# Patient Record
Sex: Male | Born: 1995 | Race: Black or African American | Hispanic: No | Marital: Single | Smoking: Never smoker
Health system: Southern US, Community
[De-identification: ages and names within clinical notes are randomized; demographics above are authoritative.]

## PROBLEM LIST (undated history)

## (undated) DIAGNOSIS — K219 Gastro-esophageal reflux disease without esophagitis: Secondary | ICD-10-CM

## (undated) DIAGNOSIS — J302 Other seasonal allergic rhinitis: Secondary | ICD-10-CM

## (undated) HISTORY — DX: Other seasonal allergic rhinitis: J30.2

## (undated) HISTORY — PX: NO PAST SURGERIES: SHX2092

## (undated) HISTORY — DX: Gastro-esophageal reflux disease without esophagitis: K21.9

---

## 2013-07-31 ENCOUNTER — Other Ambulatory Visit: Payer: Self-pay | Admitting: Family Medicine

## 2013-07-31 DIAGNOSIS — E010 Iodine-deficiency related diffuse (endemic) goiter: Secondary | ICD-10-CM

## 2013-08-07 ENCOUNTER — Ambulatory Visit
Admission: RE | Admit: 2013-08-07 | Discharge: 2013-08-07 | Disposition: A | Discharge status: Home / Self Care | Payer: BC Managed Care – PPO | Source: Ambulatory Visit | Attending: Family Medicine | Admitting: Family Medicine

## 2013-08-07 DIAGNOSIS — E010 Iodine-deficiency related diffuse (endemic) goiter: Secondary | ICD-10-CM

## 2013-08-22 ENCOUNTER — Other Ambulatory Visit: Payer: Self-pay

## 2014-10-23 IMAGING — US US SOFT TISSUE HEAD/NECK
1 series · 14 of 25 positions shown · non-contrast
Comparison: None.

CLINICAL DATA: Thyromegaly on physical exam

EXAM:
THYROID ULTRASOUND
TECHNIQUE: Ultrasound examination of the thyroid gland and adjacent soft
tissues was performed.

[Series 1: us soft tissue head/neck · 0.04mm/px · 14 of 41 slices shown]
[im 1/41]
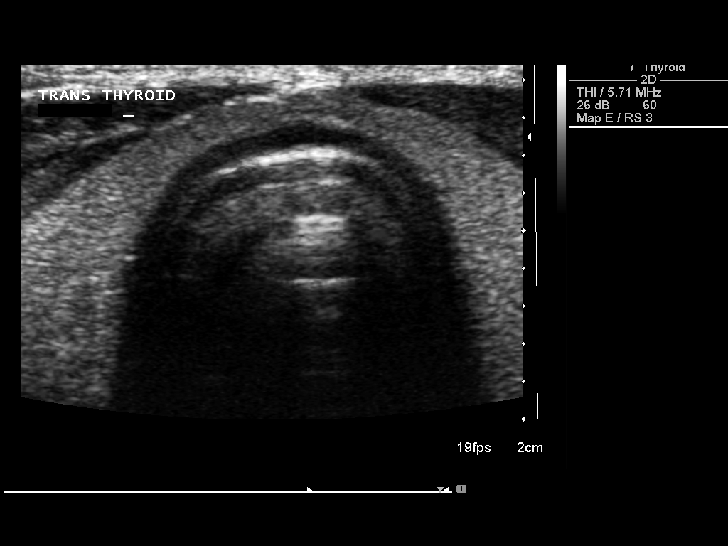
[im 4/41]
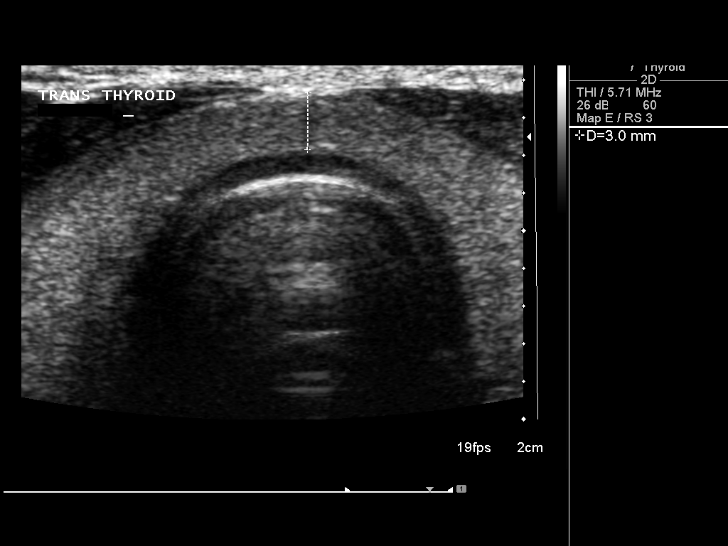
[im 7/41]
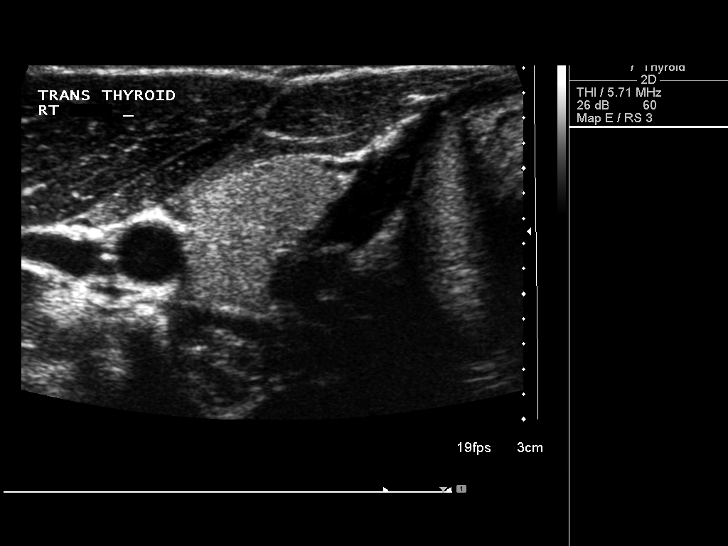
[im 11/41]
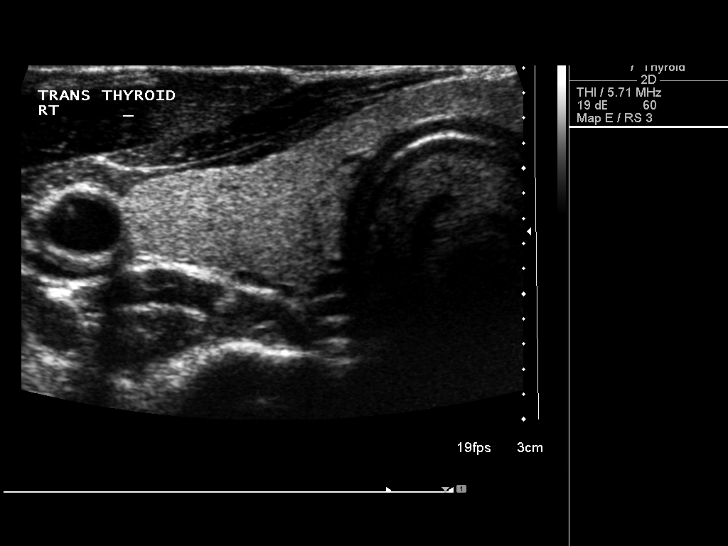
[im 14/41]
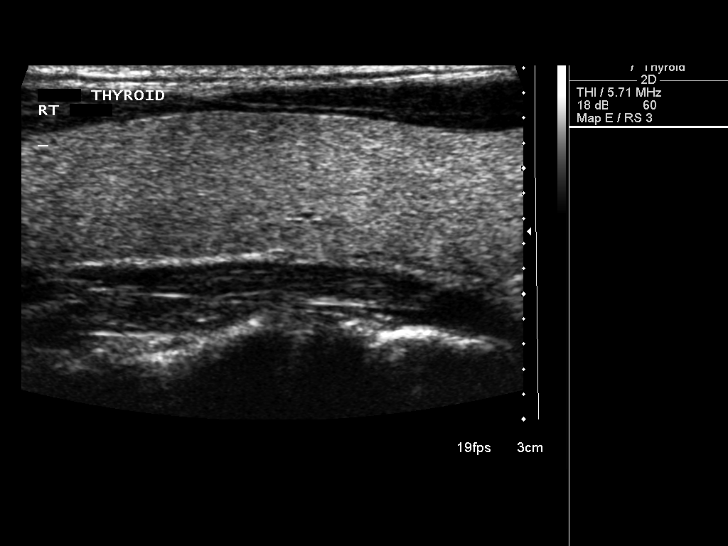
[im 16/41]
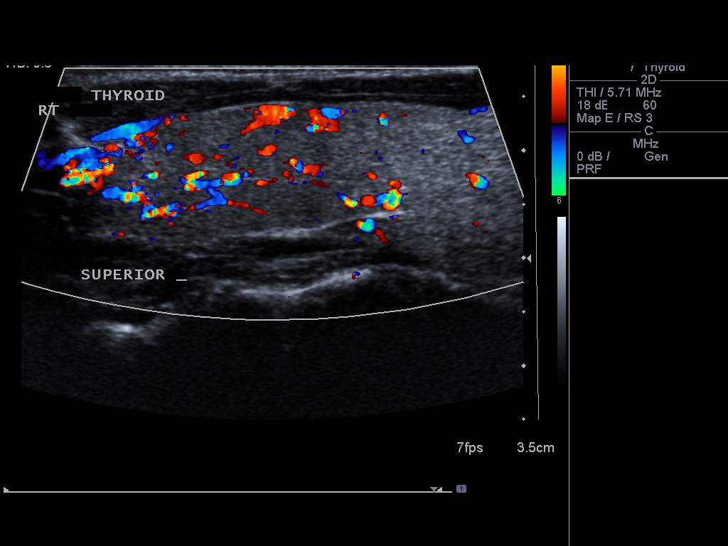
[im 19/41]
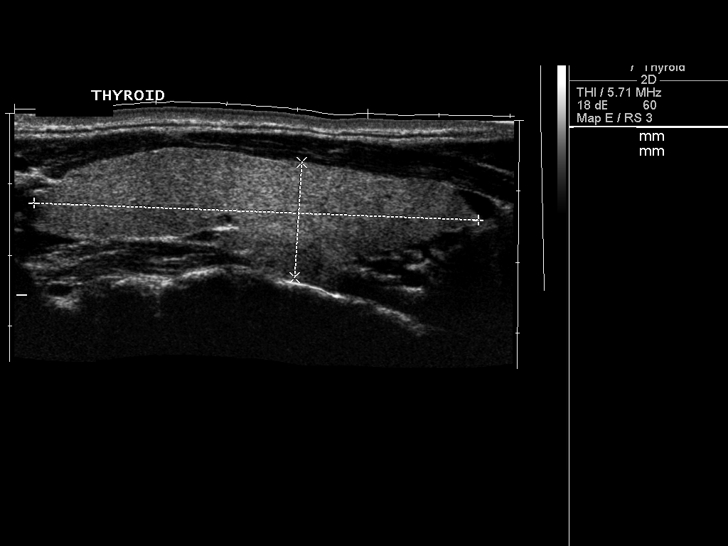
[im 22/41]
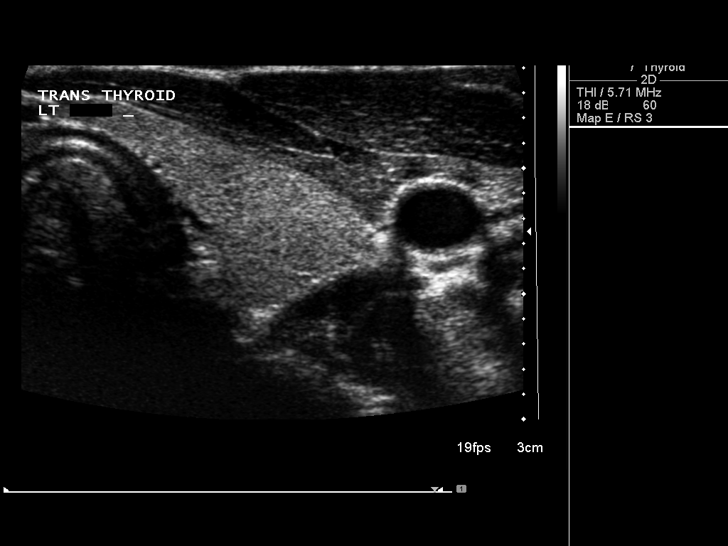
[im 26/41]
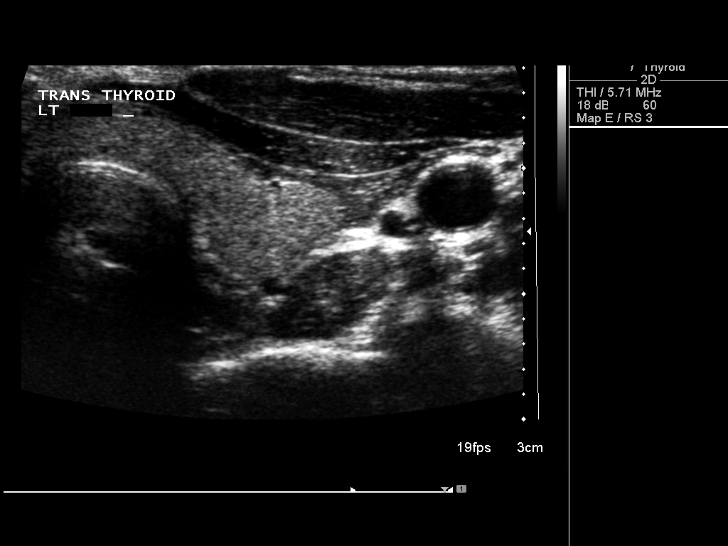
[im 27/41]
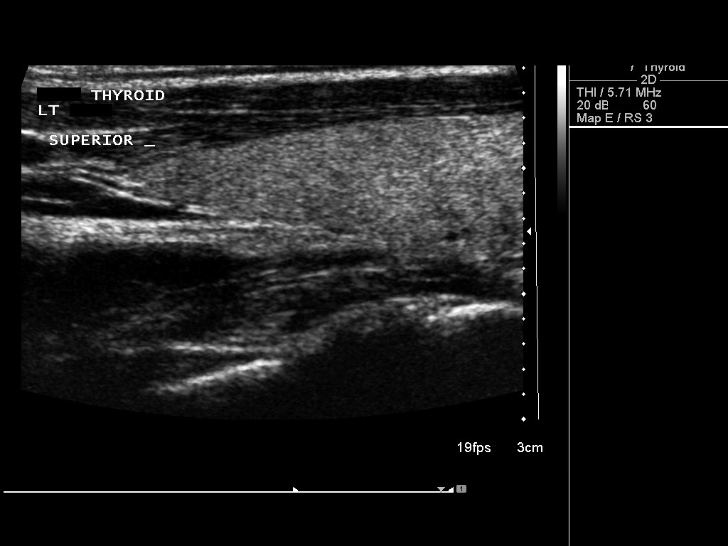
[im 31/41]
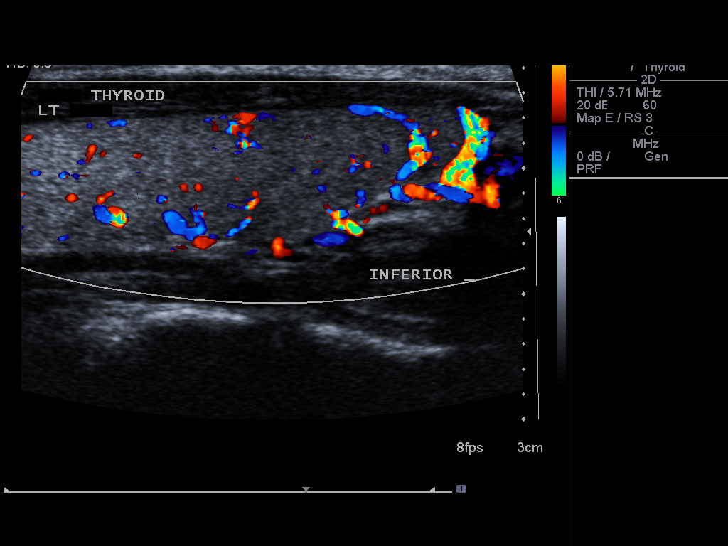
[im 34/41]
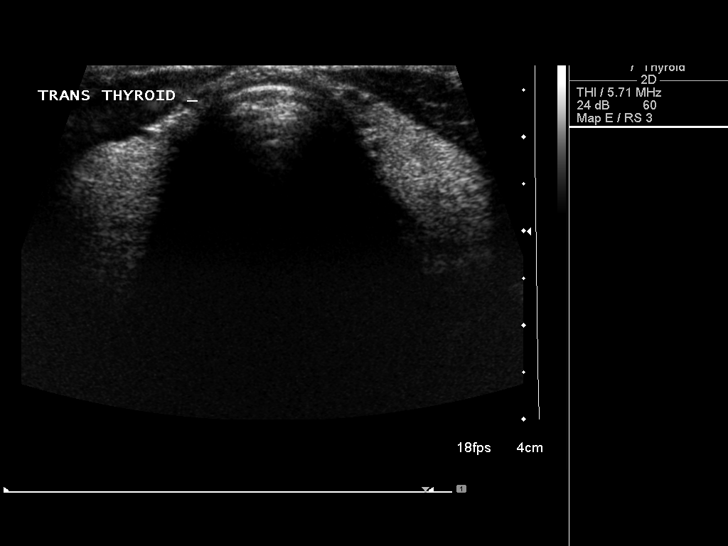
[im 37/41]
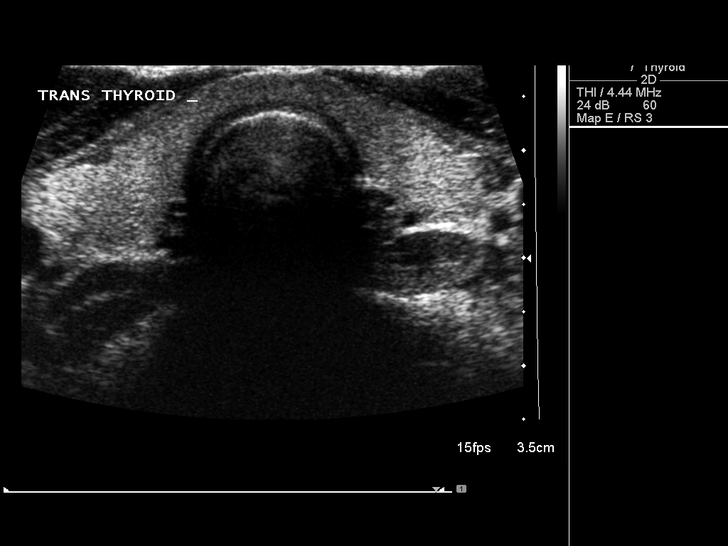
[im 41/41]
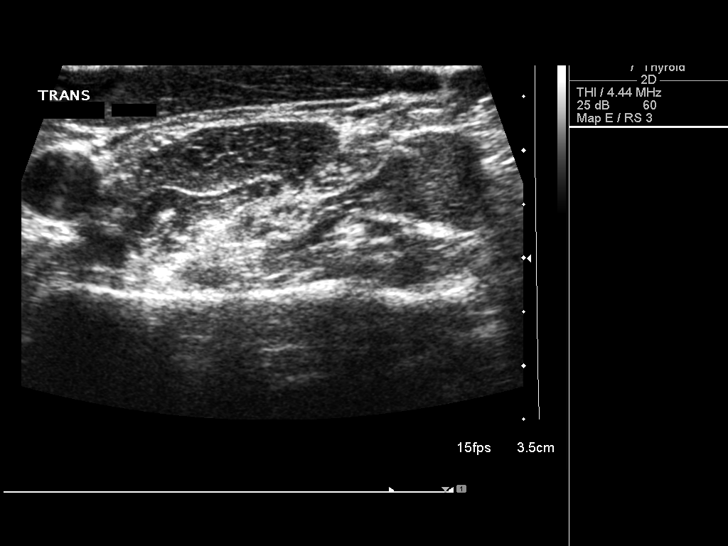

[14 of 25 positions shown; findings below may reference images not displayed]

FINDINGS: Right thyroid lobe

Measurements: 63 x 16 x 20 mm.  No nodules visualized.

Left thyroid lobe

Measurements: 55 x 12 x 16 mm.  Normal thyroid echotexture.

Isthmus

Thickness: 3.8 mm.  No nodules visualized.

Lymphadenopathy

None visualized.
IMPRESSION: Mild thyromegaly without focal lesion.

## 2015-03-27 ENCOUNTER — Ambulatory Visit (INDEPENDENT_AMBULATORY_CARE_PROVIDER_SITE_OTHER): Payer: BC Managed Care – PPO | Admitting: Neurology

## 2015-03-27 ENCOUNTER — Encounter: Payer: Self-pay | Admitting: Neurology

## 2015-03-27 VITALS — BP 110/72 | HR 63 | Ht 68.0 in | Wt 119.0 lb

## 2015-03-27 DIAGNOSIS — G250 Essential tremor: Secondary | ICD-10-CM | POA: Diagnosis not present

## 2015-03-27 NOTE — Progress Notes (Signed)
Note routed to Dr Manson Passey.

## 2015-03-27 NOTE — Progress Notes (Signed)
Subjective:   Aaron Patterson was seen in consultation in the movement disorder clinic at the request of Boneta Lucks, NP.  The evaluation is for tremor.  He is accompanied by his mother who supplements the history.  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  The right hand shakes more than the left but both are involved.  He is right hand dominant.  It is most with use and never with rest.  He notices it most when writing.  His dad may have a little tremor but he is unsure.  No supplements except whey protein.  No creatine.  Doesn't really bother him enough to want to take medication.   Affected by caffeine: doesn't know (rarely drinks it) Affected by alcohol:  Does drink some but doesn't know if it affects it Affected by stress:  Yes.   Affected by fatigue:  No. Spills soup if on spoon:  No. Spills glass of liquid if full:  No. Affects ADL's (tying shoes, brushing teeth, etc):  No.  Current/Previously tried tremor medications: None  Current medications that may exacerbate tremor:  None  Outside reports reviewed: historical medical records, lab reports and referral letter/letters.  No Known Allergies  Outpatient Encounter Prescriptions as of 03/27/2015  Medication Sig  . cetirizine (ZYRTEC) 10 MG tablet Take 10 mg by mouth daily.  Marland Kitchen doxycycline (ADOXA) 100 MG tablet Take 100 mg by mouth 2 (two) times daily.  . pantoprazole (PROTONIX) 40 MG tablet Take 40 mg by mouth daily.  Marland Kitchen tretinoin (RETIN-A) 0.05 % cream Apply topically at bedtime.   No facility-administered encounter medications on file as of 03/27/2015.    Past Medical History  Diagnosis Date  . GERD (gastroesophageal reflux disease)   . Seasonal allergies     Past Surgical History  Procedure Laterality Date  . No past surgeries      Social History   Social History  . Marital Status: Single    Spouse Name: N/A  . Number of Children: N/A  . Years of Education: N/A    Occupational History  . Not on file.   Social History Main Topics  . Smoking status: Never Smoker   . Smokeless tobacco: Not on file  . Alcohol Use: 0.0 oz/week    0 Standard drinks or equivalent per week     Comment: once a month  . Drug Use: No  . Sexual Activity: Not on file   Other Topics Concern  . Not on file   Social History Narrative  . No narrative on file    Family Status  Relation Status Death Age  . Father Alive     HTN  . Mother Alive     healthy  . Sister Alive     healthy    Review of Systems A complete 10 system ROS was obtained and was negative apart from what is mentioned.   Objective:   VITALS:   Filed Vitals:   03/27/15 0843  BP: 110/72  Pulse: 63  Height:  (1.727 m)  Weight: 119 lb (53.978 kg)   Gen:  Appears stated age and in NAD. HEENT:  Normocephalic, atraumatic. The mucous membranes are moist. The superficial temporal arteries are without ropiness or tenderness. Cardiovascular: Regular rate and rhythm. Lungs: Clear to auscultation bilaterally. Neck: There are no carotid bruits noted bilaterally.  NEUROLOGICAL:  Orientation:  The patient is alert and oriented x 3.  Recent and remote memory  are intact.  Attention span and concentration are normal.  Able to name objects and repeat without trouble.  Fund of knowledge is appropriate Cranial nerves: There is good facial symmetry. The pupils are equal round and reactive to light bilaterally. Fundoscopic exam reveals clear disc margins bilaterally. Extraocular muscles are intact and visual fields are full to confrontational testing. Speech is fluent and clear. Soft palate rises symmetrically and there is no tongue deviation. Hearing is intact to conversational tone. Tone: Tone is good throughout. Sensation: Sensation is intact to light touch and pinprick throughout (facial, trunk, extremities). Vibration is intact at the bilateral big toe. There is no extinction with double simultaneous  stimulation. There is no sensory dermatomal level identified. Coordination:  The patient has no dysdiadichokinesia or dysmetria. Motor: Strength is 5/5 in the bilateral upper and lower extremities.  Shoulder shrug is equal bilaterally.  There is no pronator drift.  There are no fasciculations noted. DTR's: Deep tendon reflexes are 2/4 at the bilateral biceps, triceps, brachioradialis, patella and achilles.  Plantar responses are downgoing bilaterally. Gait and Station: The patient is able to ambulate without difficulty. The patient is able to heel toe walk without any difficulty. The patient is able to ambulate in a tandem fashion. The patient is able to stand in the Romberg position.   MOVEMENT EXAM: Tremor:  There is mild tremor in the UE, noted most significantly with action.  The patient is able to draw Archimedes spirals without significant difficulty.  There is no tremor at rest.  The patient is able to pour water from one glass to another without spilling it.  Labs  I got lab work from his primary care physician dated 10/14/2014.  His white blood cells were 6.3, hemoglobin 15.7, hematocrit 48.9 and platelets 340.  His sodium was 140, potassium 4.2, chloride 104, CO2 31, glucose 82, AST, 15, ALT 11 and alkaline phosphatase 53.  His TSH was normal at 2.42.     Assessment/Plan:   1.  Essential Tremor.  -This is evidenced by the symmetrical nature and history of getting worse, although very slowly so.  He really wants no treatment for this.  There was nothing focal or lateralizing on his examination today and I saw no reason to scan his brain.  I reviewed lab work from his primary care physician, as above.  I talked to him and his mother about nature and pathophysiology of this.  I did tell them that this could slowly get worse with time and he may want treatment in the future.  If so, they can certainly let me know.  They were given patient handout/literature on essential tremor.  They will  follow up with me on an as-needed basis.  Greater than 50% of the 45 minute visit was spent in counseling.

## 2017-06-21 NOTE — Progress Notes (Addendum)
Subjective:   Aaron Patterson was seen in consultation in the movement disorder clinic.   The evaluation is for tremor.  He is accompanied by his mother who supplements the history.  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  The right hand shakes more than the left but both are involved.  He is right hand dominant.  It is most with use and never with rest.  He notices it most when writing.  His dad may have a little tremor but he is unsure.  No supplements except whey protein.  No creatine.  Doesn't really bother him enough to want to take medication.   Affected by caffeine: doesn't know (rarely drinks it) Affected by alcohol:  Does drink some but doesn't know if it affects it Affected by stress:  Yes.   Affected by fatigue:  No. Spills soup if on spoon:  No. Spills glass of liquid if full:  No. Affects ADL's (tying shoes, brushing teeth, etc):  No.  Current/Previously tried tremor medications: None  Current medications that may exacerbate tremor:  None  Outside reports reviewed: historical medical records, lab reports and referral letter/letters.  06/23/17 update:  Pt seen in f/u for tremor.  I have not seen him in over 2 years.  Tremor has not changed significantly in that time.  He is trying to get in [employer] now.  They are doing PE for patient to be able to enter.  Tremor not interfering with ADL's.  Not bothersome with anything he needs to do.  No increase with stress/nervousness.  States that had bloodwork last month.  No falls.  No lateralizing weakness or paresthesias.    No Known Allergies  Outpatient Encounter Medications as of 06/23/2017  Medication Sig  . cetirizine (ZYRTEC) 10 MG tablet Take 10 mg as needed by mouth.   . pantoprazole (PROTONIX) 40 MG tablet Take 40 mg as needed by mouth.   . [DISCONTINUED] doxycycline (ADOXA) 100 MG tablet Take 100 mg by mouth 2 (two) times daily.  . [DISCONTINUED] tretinoin (RETIN-A) 0.05 % cream  Apply topically at bedtime.   No facility-administered encounter medications on file as of 06/23/2017.     Past Medical History:  Diagnosis Date  . GERD (gastroesophageal reflux disease)   . Seasonal allergies     Past Surgical History:  Procedure Laterality Date  . NO PAST SURGERIES      Social History   Socioeconomic History  . Marital status: Single    Spouse name: Not on file  . Number of children: Not on file  . Years of education: Not on file  . Highest education level: Not on file  Social Needs  . Financial resource strain: Not on file  . Food insecurity - worry: Not on file  . Food insecurity - inability: Not on file  . Transportation needs - medical: Not on file  . Transportation needs - non-medical: Not on file  Occupational History  . Occupation: [employer]  Tobacco Use  . Smoking status: Never Smoker  . Smokeless tobacco: Never Used  Substance and Sexual Activity  . Alcohol use: Yes    Alcohol/week: 0.0 oz    Comment: once a month  . Drug use: No  . Sexual activity: Not on file  Other Topics Concern  . Not on file  Social History Narrative  . Not on file    Family Status  Relation Name Status  . Father  Alive  HTN  . Mother  Alive       healthy  . Sister  Alive       healthy    Review of Systems A complete 10 system ROS was obtained and was negative apart from what is mentioned.   Objective:    VITALS:   Vitals:   06/23/17 1257  BP: 128/78  Pulse: 70  SpO2: 98%  Weight: 126 lb (57.2 kg)  Height: 5\' 8"  (1.727 m)   Gen:  Appears stated age and in NAD. HEENT:  Normocephalic, atraumatic. The mucous membranes are moist. The superficial temporal arteries are without ropiness or tenderness. Cardiovascular: Regular rate and rhythm. Lungs: Clear to auscultation bilaterally. Neck: There are no carotid bruits noted bilaterally.  NEUROLOGICAL:  Orientation:  The patient is alert and oriented x 3.  Cranial nerves: There is good facial  symmetry. The pupils are equal round and reactive to light bilaterally. Fundoscopic exam reveals clear disc margins bilaterally. Extraocular muscles are intact and visual fields are full to confrontational testing. Speech is fluent and clear. Soft palate rises symmetrically and there is no tongue deviation. Hearing is intact to conversational tone. Tone: Tone is good throughout. Sensation: Sensation is intact to light touch.   Vibration is intact at the bilateral big toe. There is no extinction with double simultaneous stimulation. There is no sensory dermatomal level identified. Coordination:  The patient has no difficulty with RAM's or FNF bilaterally. Motor: Strength is 5/5 in the bilateral upper and lower extremities.  Shoulder shrug is equal bilaterally.  There is no pronator drift.  There are no fasciculations noted. DTR's: Deep tendon reflexes are 2+/4 at the bilateral biceps, triceps, brachioradialis, patella and achilles.  Plantar responses are downgoing bilaterally. Gait and Station: The patient is able to ambulate without difficulty. The patient is able to heel toe walk without any difficulty. The patient is able to ambulate in a tandem fashion.   MOVEMENT EXAM: Tremor:  There is no rest tremor.  There is mild postural tremor on the right.  It doesn't increase with intention.  It is better with a weight in the hand.  he has no difficulty with archimedes spirals (compared drawing to 2 years ago and no significant changeI.  he has no difficulty when asked to pour a full glass of water from one glass to another.     Assessment/Plan:   1.  Essential tremor  -no significant change in examination from 2 years ago  - We discussed nature and pathophysiology.  We discussed that this can continue to gradually get worse with time.  His has not gotten worse clinically in the last 2 years.  I see no reason from a neurologic standpoint why he cannot enter [employer].  He needs no medications from  my standpoint.

## 2017-06-23 ENCOUNTER — Ambulatory Visit: Payer: BC Managed Care – PPO | Admitting: Neurology

## 2017-06-23 ENCOUNTER — Encounter: Payer: Self-pay | Admitting: Neurology

## 2017-06-23 VITALS — BP 128/78 | HR 70 | Ht 68.0 in | Wt 126.0 lb

## 2017-06-23 DIAGNOSIS — G250 Essential tremor: Secondary | ICD-10-CM

## 2017-07-04 ENCOUNTER — Ambulatory Visit: Payer: BC Managed Care – PPO | Admitting: Neurology

## 2017-09-19 ENCOUNTER — Ambulatory Visit: Payer: BC Managed Care – PPO | Admitting: Neurology

## 2017-12-22 ENCOUNTER — Encounter: Payer: Self-pay | Admitting: Neurology

## 2017-12-22 ENCOUNTER — Telehealth: Payer: Self-pay | Admitting: Neurology

## 2017-12-22 ENCOUNTER — Ambulatory Visit: Payer: BC Managed Care – PPO | Admitting: Neurology

## 2017-12-22 VITALS — BP 134/74 | HR 64 | Ht 68.0 in | Wt 129.0 lb

## 2017-12-22 DIAGNOSIS — G250 Essential tremor: Secondary | ICD-10-CM | POA: Diagnosis not present

## 2017-12-22 NOTE — Progress Notes (Signed)
Subjective:   Aaron Patterson was seen in consultation in the movement disorder clinic.   The evaluation is for tremor.  He is accompanied by his mother who supplements the history.  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  The right hand shakes more than the left but both are involved.  He is right hand dominant.  It is most with use and never with rest.  He notices it most when writing.  His dad may have a little tremor but he is unsure.  No supplements except whey protein.  No creatine.  Doesn't really bother him enough to want to take medication.   Affected by caffeine: doesn't know (rarely drinks it) Affected by alcohol:  Does drink some but doesn't know if it affects it Affected by stress:  Yes.   Affected by fatigue:  No. Spills soup if on spoon:  No. Spills glass of liquid if full:  No. Affects ADL's (tying shoes, brushing teeth, etc):  No.  Current/Previously tried tremor medications: None  Current medications that may exacerbate tremor:  None  Outside reports reviewed: historical medical records, lab reports and referral letter/letters.  06/23/17 update:  Pt seen in f/u for tremor.  I have not seen him in over 2 years.  Tremor has not changed significantly in that time.  He is trying to get in [employer] now.  They are doing PE for patient to be able to enter.  Tremor not interfering with ADL's.  Not bothersome with anything he needs to do.  No increase with stress/nervousness.  States that had bloodwork last month.  No falls.  No lateralizing weakness or paresthesias.    12/22/17 update: Patient seen today in follow-up.  He is with his mother who supplements the history.   He states tremor comes and goes and at times he doesn't notice it.  No tremor that affects ADL's.  No falls.  No lateralizing weakness or paresthesias.  Still working for [employer].  Would like to join [employer]  No Known Allergies  Outpatient Encounter Medications as of  12/22/2017  Medication Sig  . cetirizine (ZYRTEC) 10 MG tablet Take 10 mg as needed by mouth.   . [DISCONTINUED] pantoprazole (PROTONIX) 40 MG tablet Take 40 mg as needed by mouth.    No facility-administered encounter medications on file as of 12/22/2017.     Past Medical History:  Diagnosis Date  . GERD (gastroesophageal reflux disease)   . Seasonal allergies     Past Surgical History:  Procedure Laterality Date  . NO PAST SURGERIES      Social History   Socioeconomic History  . Marital status: Single    Spouse name: Not on file  . Number of children: Not on file  . Years of education: Not on file  . Highest education level: Not on file  Occupational History  . Occupation: [employer]  Social Needs  . Financial resource strain: Not on file  . Food insecurity:    Worry: Not on file    Inability: Not on file  . Transportation needs:    Medical: Not on file    Non-medical: Not on file  Tobacco Use  . Smoking status: Never Smoker  . Smokeless tobacco: Never Used  Substance and Sexual Activity  . Alcohol use: Yes    Alcohol/week: 0.0 oz    Comment: once a month  . Drug use: No  . Sexual activity: Not on file  Lifestyle  . Physical activity:    Days per week: Not on file    Minutes per session: Not on file  . Stress: Not on file  Relationships  . Social connections:    Talks on phone: Not on file    Gets together: Not on file    Attends religious service: Not on file    Active member of club or organization: Not on file    Attends meetings of clubs or organizations: Not on file    Relationship status: Not on file  . Intimate partner violence:    Fear of current or ex partner: Not on file    Emotionally abused: Not on file    Physically abused: Not on file    Forced sexual activity: Not on file  Other Topics Concern  . Not on file  Social History Narrative  . Not on file    Family Status  Relation Name Status  . Father  Alive       HTN  . Mother  Alive        healthy  . Sister  Alive       healthy    Review of Systems Review of Systems  Constitutional: Negative.   HENT: Negative.   Eyes: Negative.   Respiratory: Negative.   Cardiovascular: Negative.   Gastrointestinal: Negative.   Genitourinary: Negative.   Musculoskeletal: Negative.   Skin: Negative.   Endo/Heme/Allergies: Negative.   Psychiatric/Behavioral: Negative.       Objective:    VITALS:   Vitals:   12/22/17 0900  BP: 134/74  Pulse: 64  SpO2: 97%  Weight: 129 lb (58.5 kg)  Height:  (1.727 m)    Gen:  Appears stated age and in NAD. HEENT:  Normocephalic, atraumatic. The mucous membranes are moist. The superficial temporal arteries are without ropiness or tenderness. Cardiovascular: Regular rate and rhythm. Lungs: Clear to auscultation bilaterally. Neck: There are no carotid bruits noted bilaterally.  NEUROLOGICAL:  Orientation:  The patient is alert and oriented x 3.   Cranial nerves: There is good facial symmetry.  Speech is fluent and clear. Soft palate rises symmetrically and there is no tongue deviation. Hearing is intact to conversational tone. Tone: Tone is good throughout. Sensation: Sensation is intact to light touch  throughout (facial, trunk, extremities). Vibration is intact at the bilateral big toe.  Coordination:  The patient has no difficulty with RAM's or FNF bilaterally. Motor: Strength is 5/5 in the bilateral upper and lower extremities.  Shoulder shrug is equal bilaterally.  There is no pronator drift.  There are no fasciculations noted. Tremor: The patient has no rest tremor.  He has mild intention tremor, right more than left.  He has no trouble with Archimedes spirals.  Tremor goes away when he holds a weight.      Assessment/Plan:   1.  Essential tremor  -Patient has had no significant change in tremor since 2016 when I last saw him.  -Tremor is not interfering with ADLs or any other aspect of his life.  Mother asked me about  nature and pathophysiology and discussed this today.  -Letter written for the patient at his request  2.  Follow-up as needed.

## 2017-12-22 NOTE — Telephone Encounter (Signed)
Note faxed to [employer] per patient request at [phone] with confirmation received.

## 2018-04-14 ENCOUNTER — Ambulatory Visit: Payer: BC Managed Care – PPO | Admitting: Neurology

## 2018-04-14 ENCOUNTER — Encounter
